# Patient Record
Sex: Female | Born: 1955 | Race: White | Hispanic: No
Health system: Southern US, Community
[De-identification: ages and names within clinical notes are randomized; demographics above are authoritative.]

---

## 2006-01-26 ENCOUNTER — Ambulatory Visit (HOSPITAL_COMMUNITY): Admission: RE | Admit: 2006-01-26 | Discharge: 2006-01-26 | Payer: Self-pay | Admitting: Internal Medicine

## 2006-02-23 ENCOUNTER — Ambulatory Visit (HOSPITAL_COMMUNITY): Admission: RE | Admit: 2006-02-23 | Discharge: 2006-02-23 | Payer: Self-pay | Admitting: Internal Medicine

## 2006-04-01 ENCOUNTER — Ambulatory Visit: Payer: Self-pay | Admitting: Orthopedic Surgery

## 2006-04-22 ENCOUNTER — Ambulatory Visit: Payer: Self-pay | Admitting: Orthopedic Surgery

## 2006-05-27 ENCOUNTER — Ambulatory Visit: Payer: Self-pay | Admitting: Orthopedic Surgery

## 2006-06-21 ENCOUNTER — Encounter: Admission: RE | Admit: 2006-06-21 | Discharge: 2006-06-21 | Payer: Self-pay | Admitting: Rheumatology

## 2010-06-15 ENCOUNTER — Encounter: Payer: Self-pay | Admitting: Orthopedic Surgery

## 2011-10-20 ENCOUNTER — Other Ambulatory Visit (HOSPITAL_COMMUNITY): Payer: Self-pay | Admitting: Family Medicine

## 2011-10-20 ENCOUNTER — Ambulatory Visit (HOSPITAL_COMMUNITY)
Admission: RE | Admit: 2011-10-20 | Discharge: 2011-10-20 | Disposition: A | Discharge status: Home / Self Care | Payer: BC Managed Care – PPO | Source: Ambulatory Visit | Attending: Family Medicine | Admitting: Family Medicine

## 2011-10-20 DIAGNOSIS — R05 Cough: Secondary | ICD-10-CM

## 2011-10-20 DIAGNOSIS — M898X9 Other specified disorders of bone, unspecified site: Secondary | ICD-10-CM | POA: Insufficient documentation

## 2011-10-20 DIAGNOSIS — R059 Cough, unspecified: Secondary | ICD-10-CM

## 2011-10-20 DIAGNOSIS — M899 Disorder of bone, unspecified: Secondary | ICD-10-CM | POA: Insufficient documentation

## 2011-10-20 DIAGNOSIS — M949 Disorder of cartilage, unspecified: Secondary | ICD-10-CM | POA: Insufficient documentation

## 2013-08-18 ENCOUNTER — Ambulatory Visit (HOSPITAL_COMMUNITY)
Admission: RE | Admit: 2013-08-18 | Discharge: 2013-08-18 | Disposition: A | Discharge status: Home / Self Care | Payer: BC Managed Care – PPO | Source: Ambulatory Visit | Attending: Family Medicine | Admitting: Family Medicine

## 2013-08-18 ENCOUNTER — Other Ambulatory Visit (HOSPITAL_COMMUNITY): Payer: Self-pay | Admitting: Family Medicine

## 2013-08-18 DIAGNOSIS — M47817 Spondylosis without myelopathy or radiculopathy, lumbosacral region: Secondary | ICD-10-CM | POA: Diagnosis not present

## 2013-08-18 DIAGNOSIS — M259 Joint disorder, unspecified: Secondary | ICD-10-CM | POA: Insufficient documentation

## 2013-08-18 DIAGNOSIS — M25559 Pain in unspecified hip: Secondary | ICD-10-CM | POA: Diagnosis present

## 2013-08-18 DIAGNOSIS — R52 Pain, unspecified: Secondary | ICD-10-CM

## 2013-08-31 IMAGING — CR DG CHEST 2V
2 series · 2 of 2 positions shown · non-contrast
Comparison: Chest radiograph 06/21/2006

CLINICAL DATA: Productive cough off and on for over 1 month

CHEST - 2 VIEW

[view not recorded (1 of 2)]
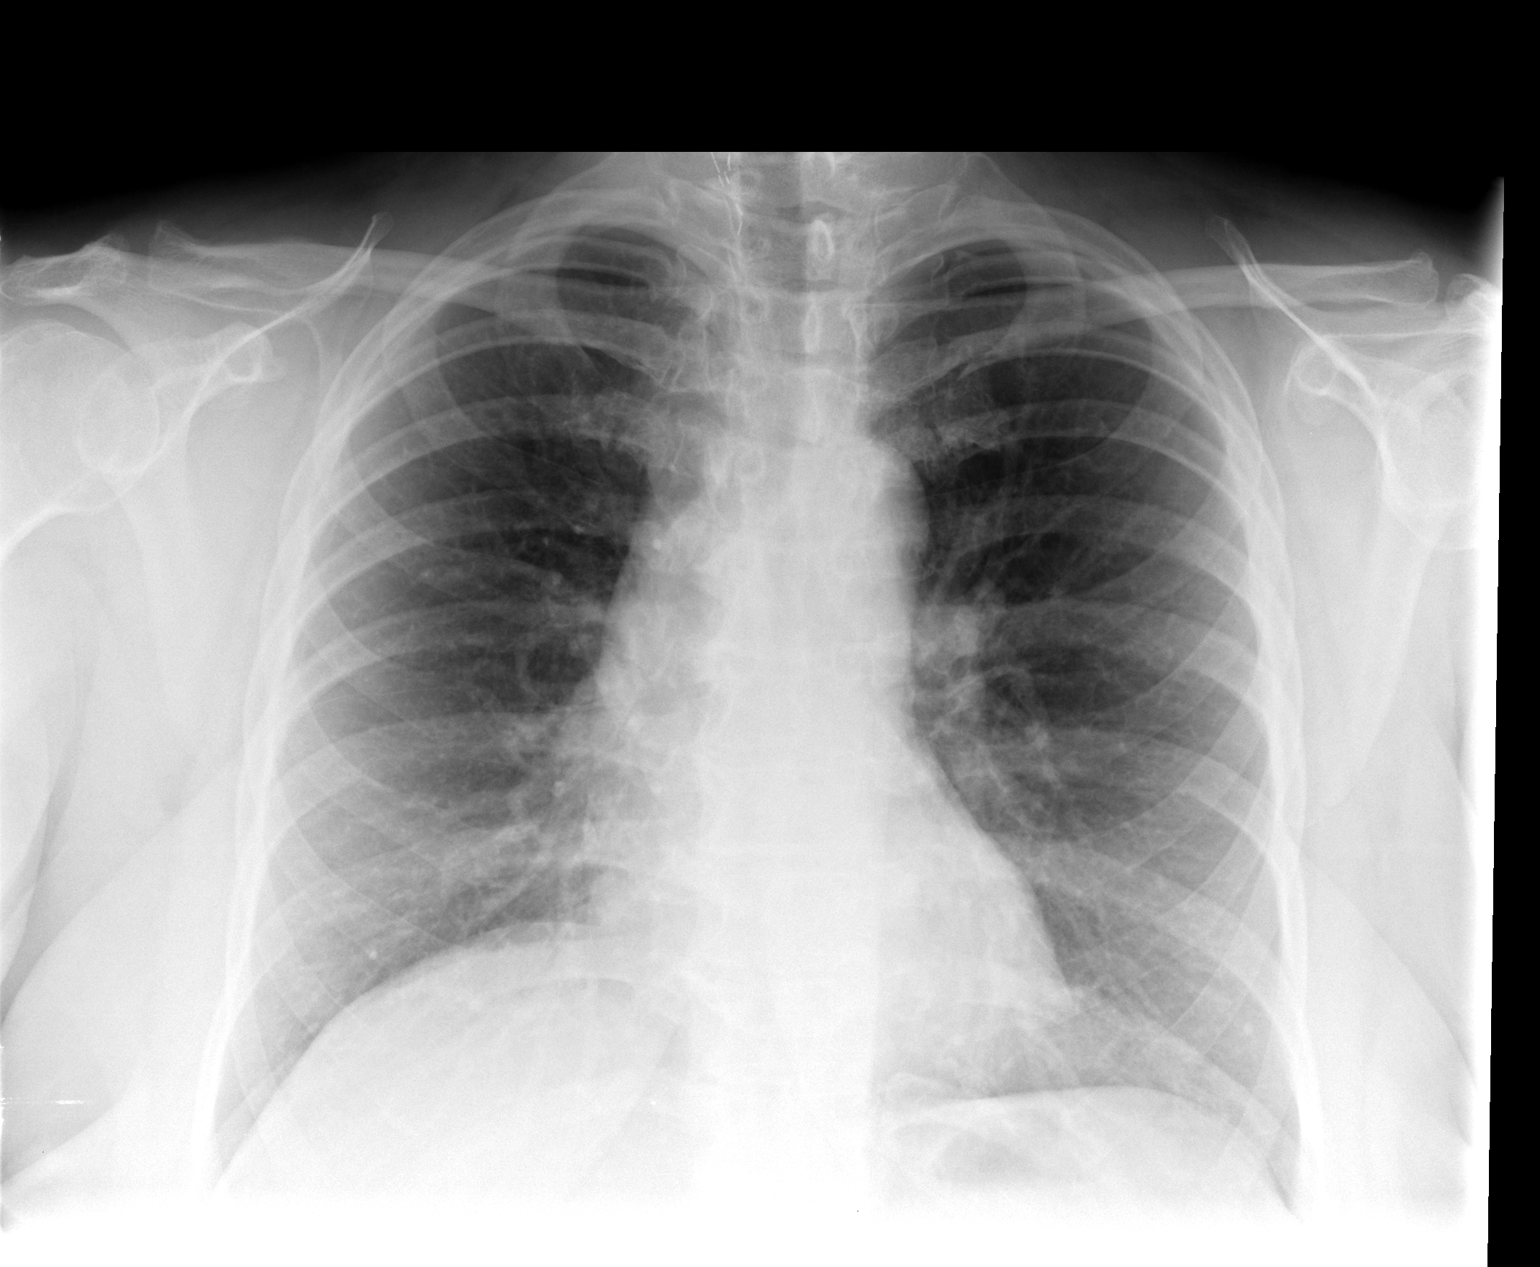

[view not recorded (2 of 2)]
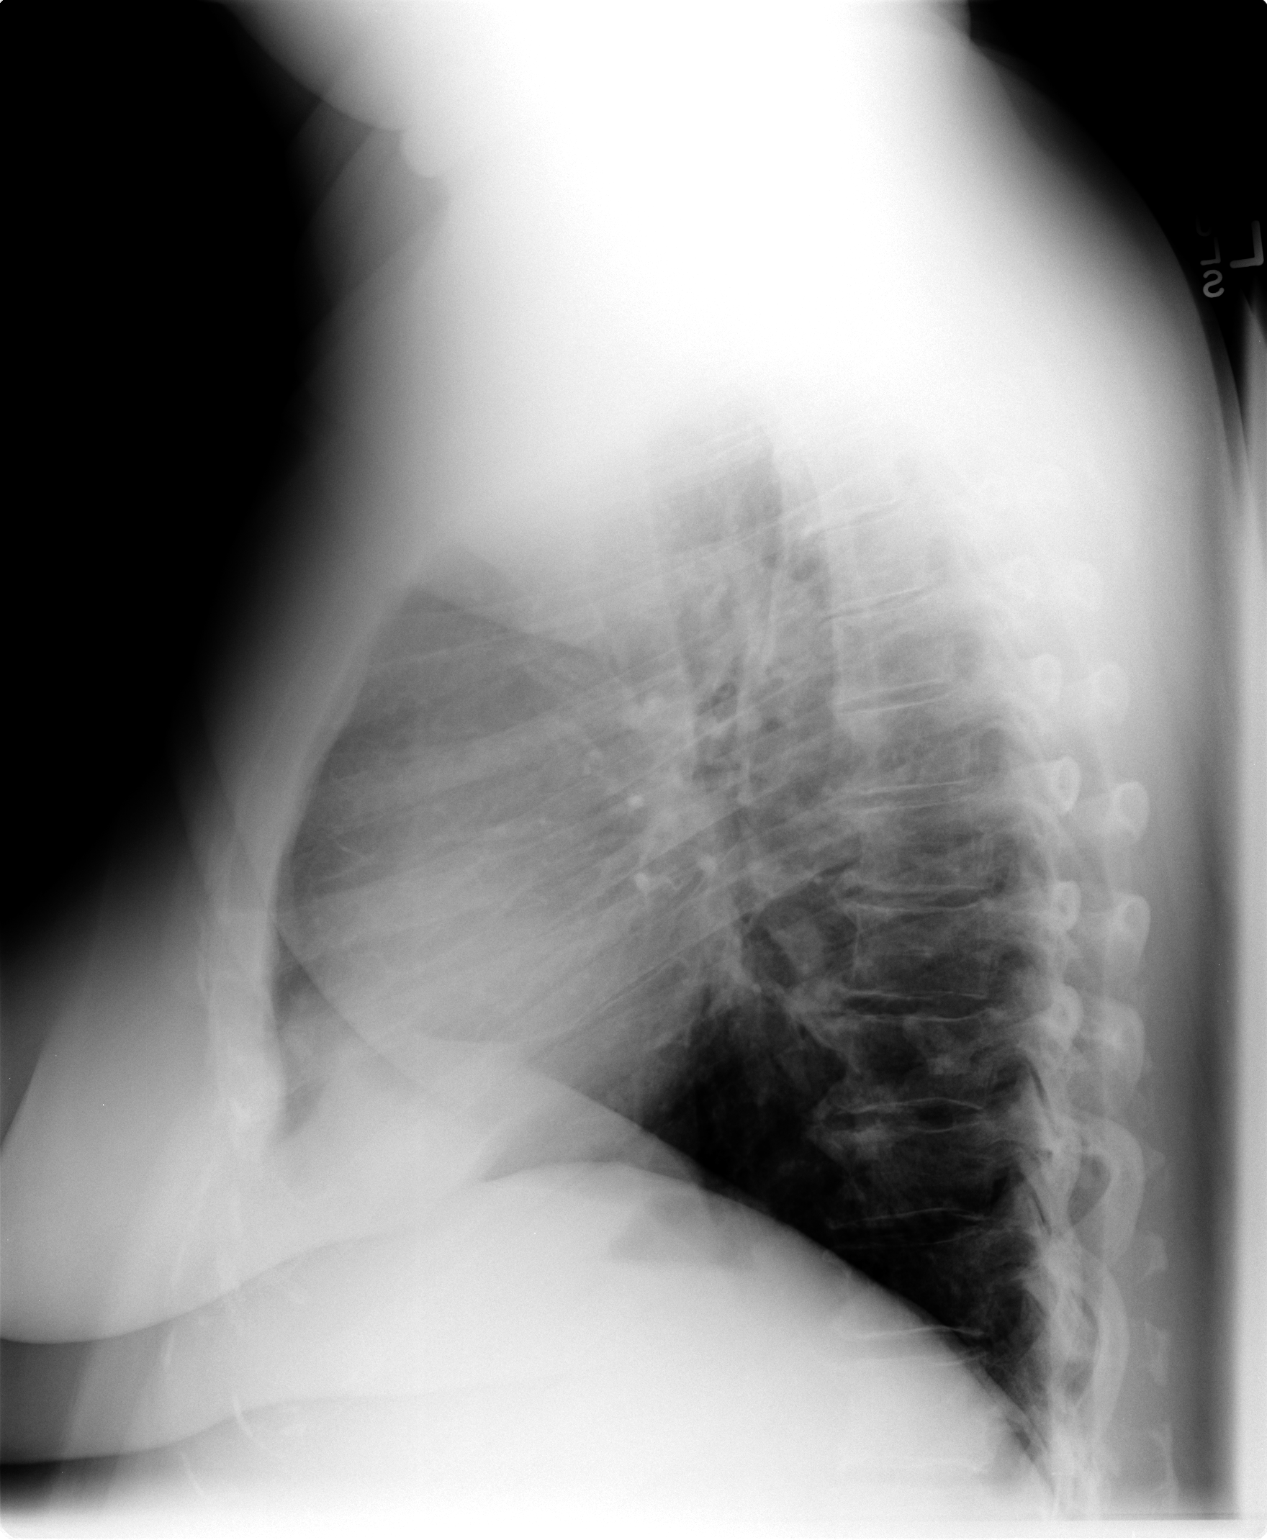

[2 of 2 positions shown; findings below may reference images not displayed]

FINDINGS: The heart, mediastinal, and hilar contours are within
normal limits.  The pulmonary vascularity is normal.  The lungs are
clear.  No focal airspace opacities, edema, or effusion.  No
significant change appreciated compared to prior chest radiograph.
Surgical clips in the right neck suggest prior right thyroidectomy.
IMPRESSION: No acute cardiopulmonary disease.

## 2015-06-30 IMAGING — CR DG HIP COMPLETE 2+V*R*
3 series · 3 of 3 positions shown · non-contrast
Comparison: None.

CLINICAL DATA: Hip pain.

EXAM:
RIGHT HIP - COMPLETE 2+ VIEW

[view not recorded (1 of 3)]
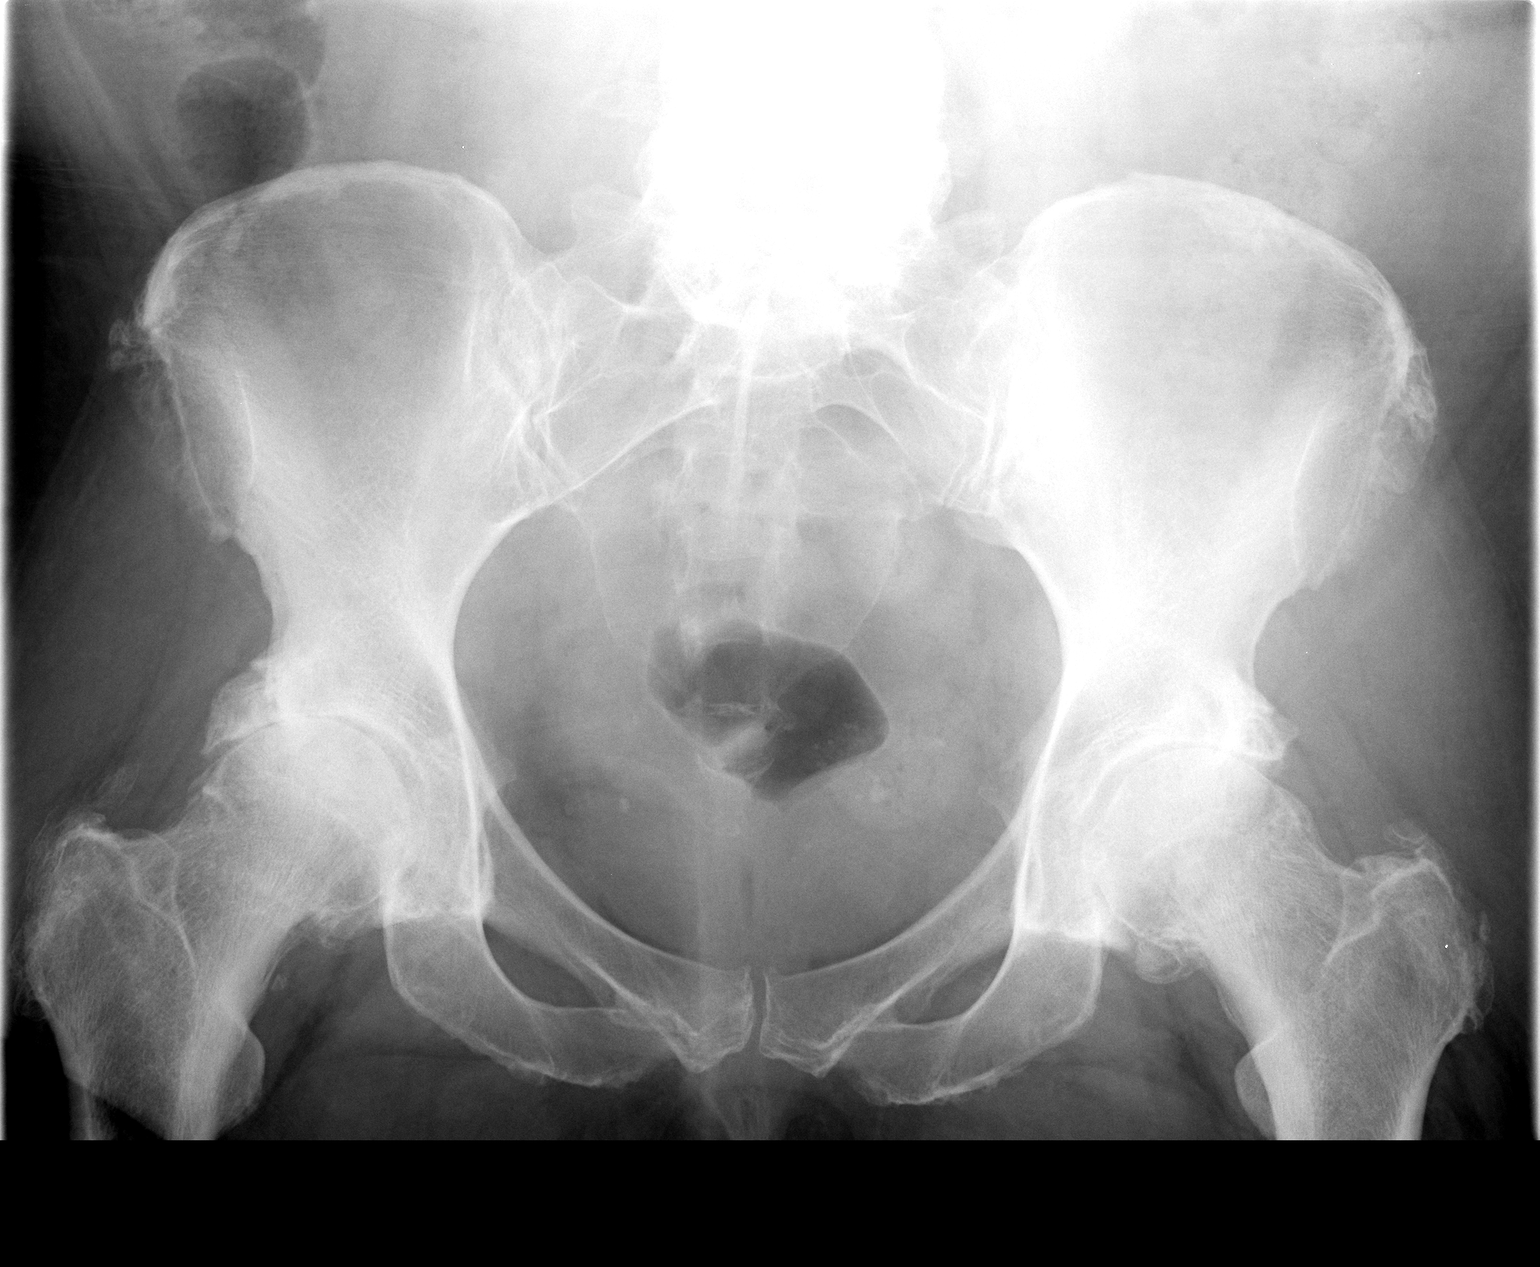

[view not recorded (2 of 3)]
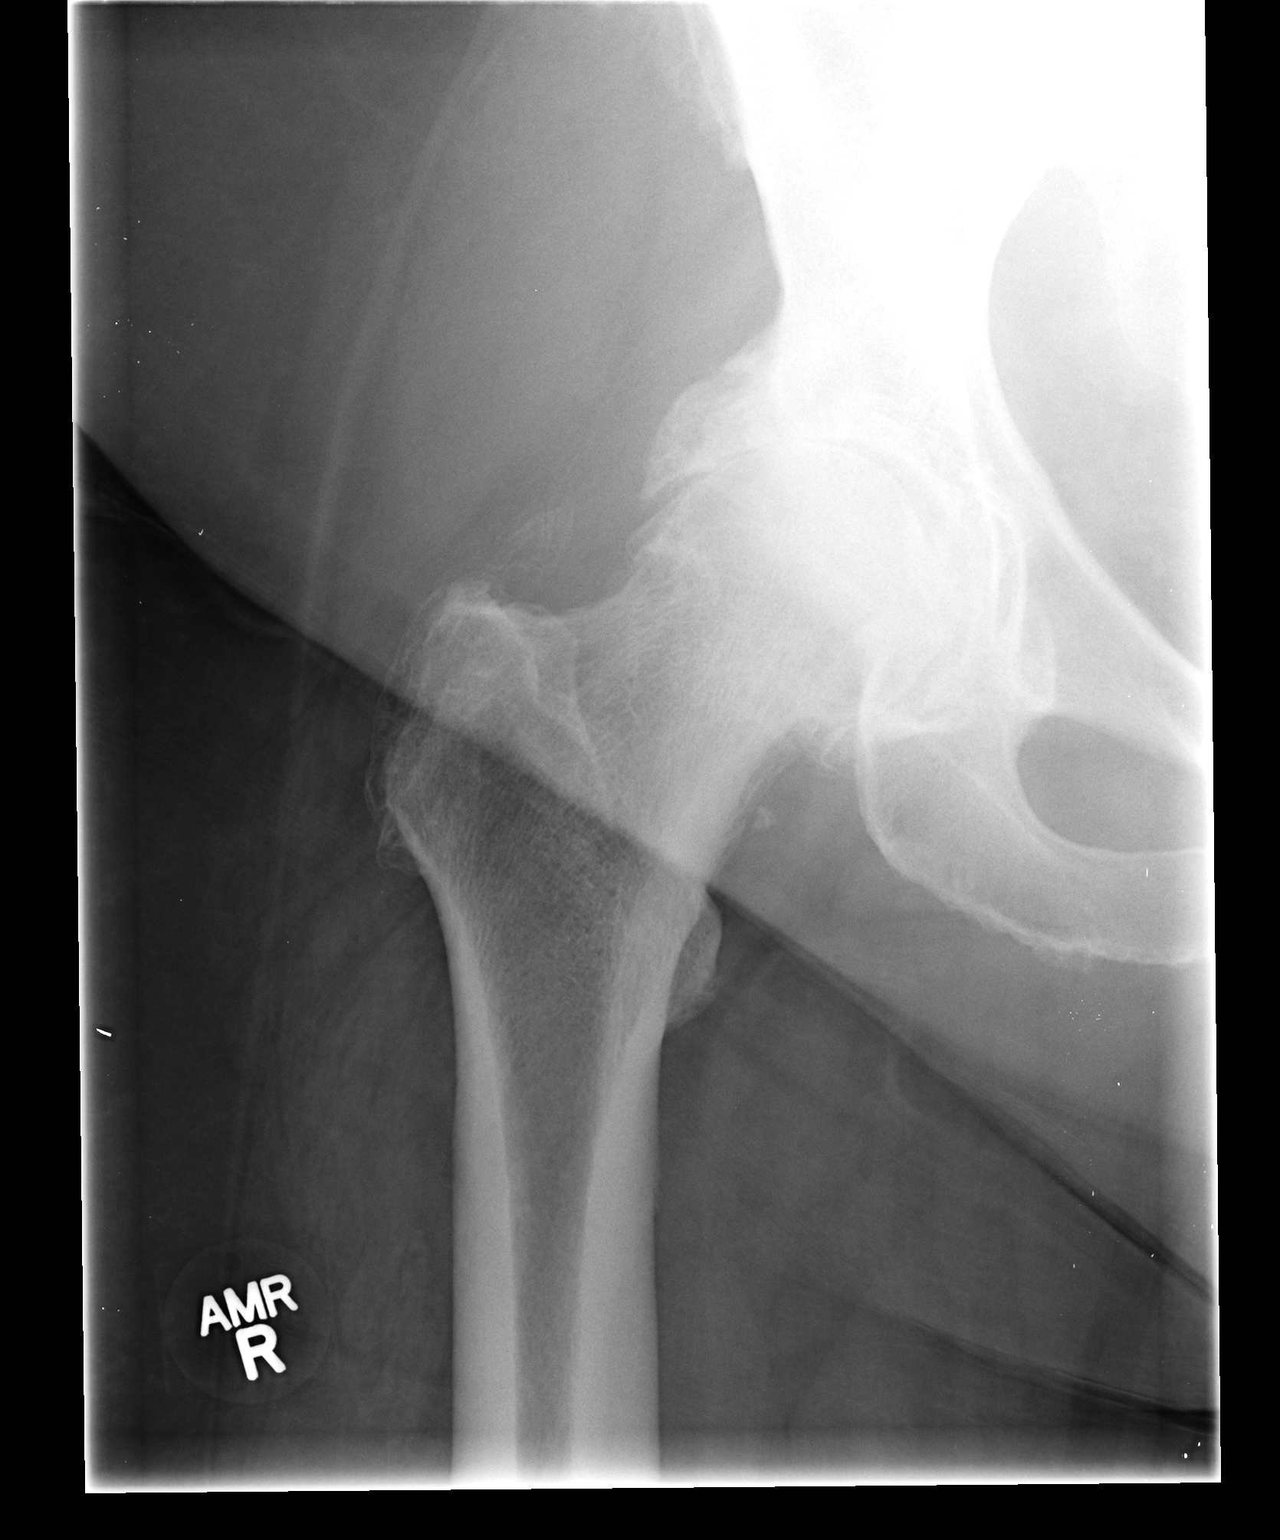

[view not recorded (3 of 3)]
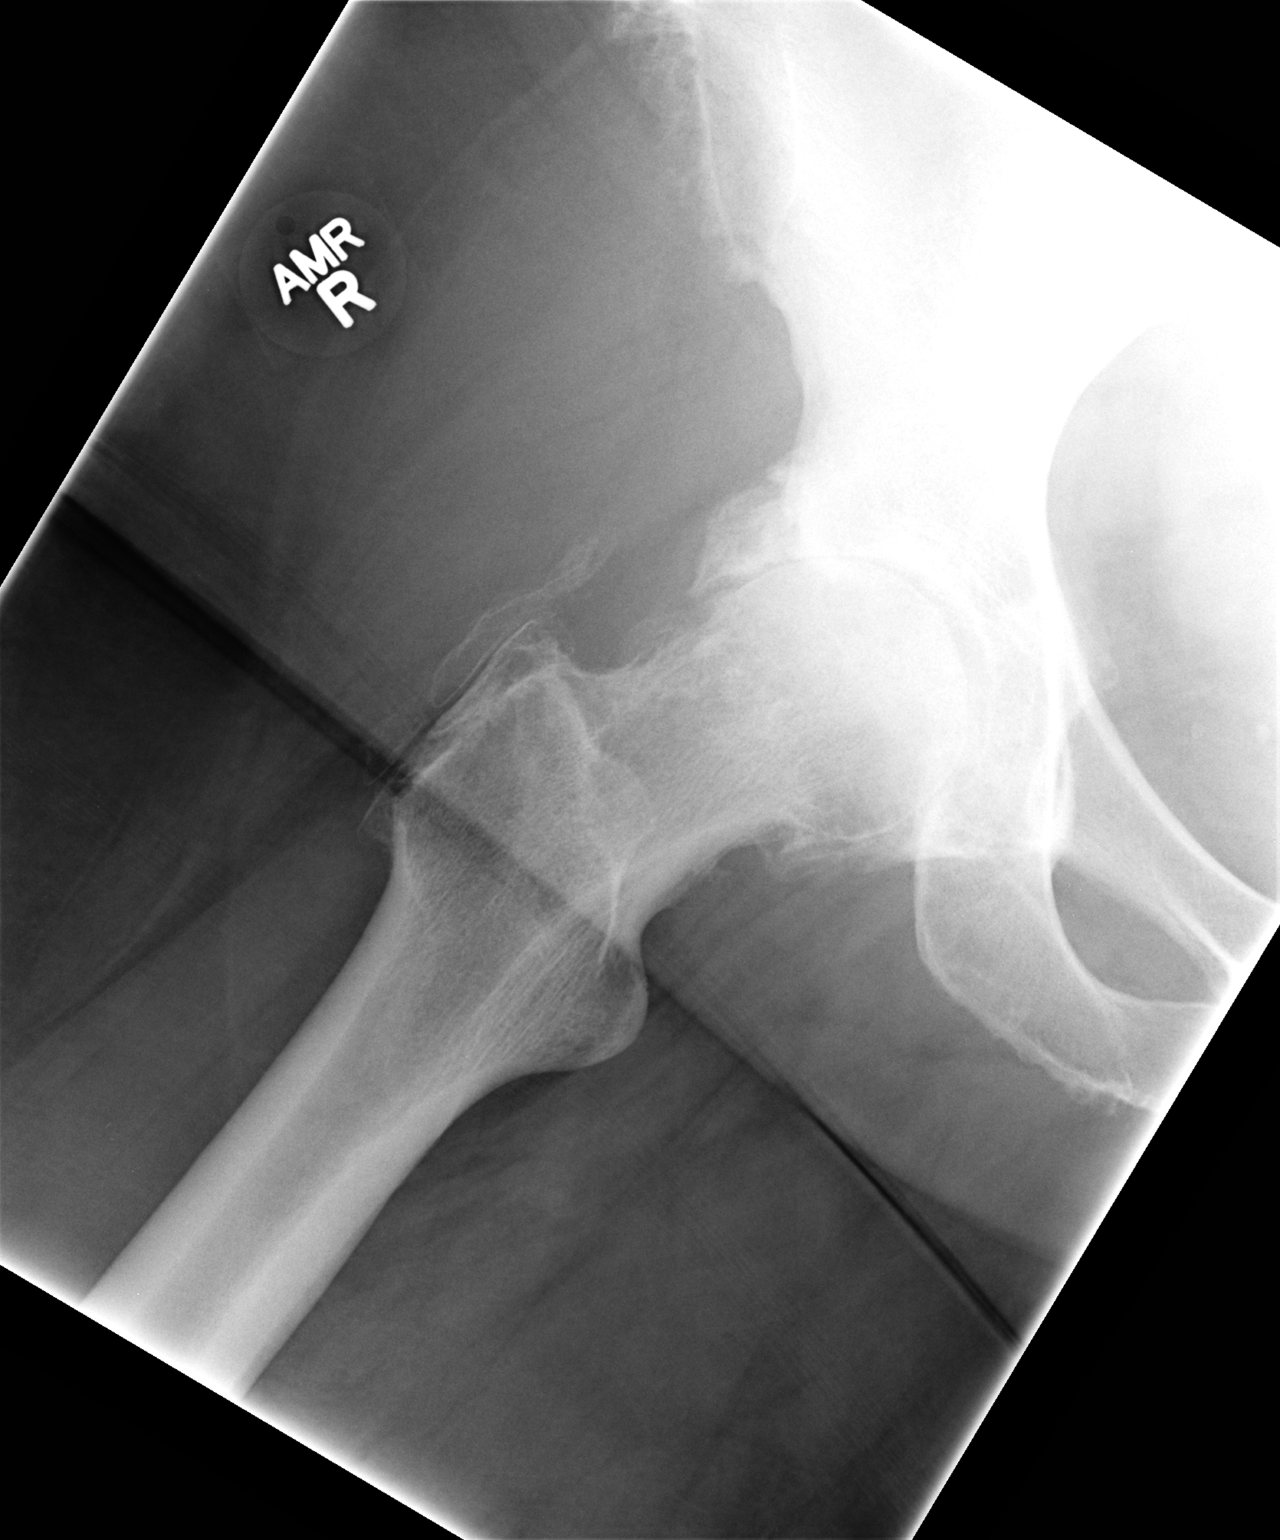

[3 of 3 positions shown; findings below may reference images not displayed]

FINDINGS: Severe degenerative changes of the lumbar spine and both hips. No
evidence of fracture or dislocation. Calcifications within pelvis
consistent with phleboliths.
IMPRESSION: Severe degenerative changes lumbar spine and both hips. No acute
abnormality.

## 2015-12-02 ENCOUNTER — Ambulatory Visit (INDEPENDENT_AMBULATORY_CARE_PROVIDER_SITE_OTHER): Payer: Medicare Other | Admitting: Otolaryngology
